# Patient Record
Sex: Male | Born: 1975 | Race: Black or African American | Marital: Married
Health system: Southern US, Community
[De-identification: ages and names within clinical notes are randomized; demographics above are authoritative.]

---

## 2011-01-25 ENCOUNTER — Encounter: Payer: Self-pay | Admitting: Podiatry

## 2013-08-10 ENCOUNTER — Ambulatory Visit
Admission: RE | Admit: 2013-08-10 | Discharge: 2013-08-10 | Disposition: A | Discharge status: Home / Self Care | Payer: PRIVATE HEALTH INSURANCE | Source: Ambulatory Visit | Attending: Family Medicine | Admitting: Family Medicine

## 2013-08-10 ENCOUNTER — Other Ambulatory Visit: Payer: Self-pay | Admitting: Family Medicine

## 2013-08-10 DIAGNOSIS — M542 Cervicalgia: Secondary | ICD-10-CM

## 2013-08-10 DIAGNOSIS — M25512 Pain in left shoulder: Secondary | ICD-10-CM

## 2014-08-11 IMAGING — CR DG CERVICAL SPINE COMPLETE 4+V
6 series · 6 of 6 positions shown · non-contrast
Comparison: None.

CLINICAL DATA: Neck pain with radicular symptoms

EXAM:
CERVICAL SPINE  4+ VIEWS

[view not recorded (1 of 6)]
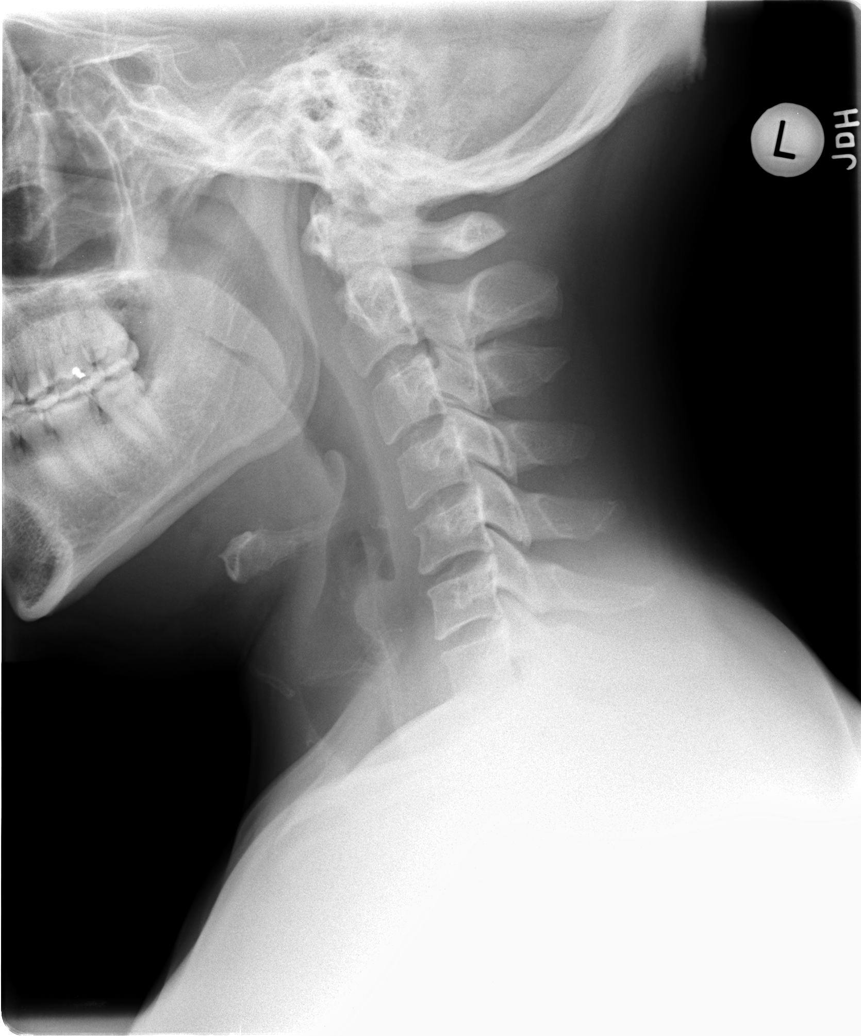

[view not recorded (2 of 6)]
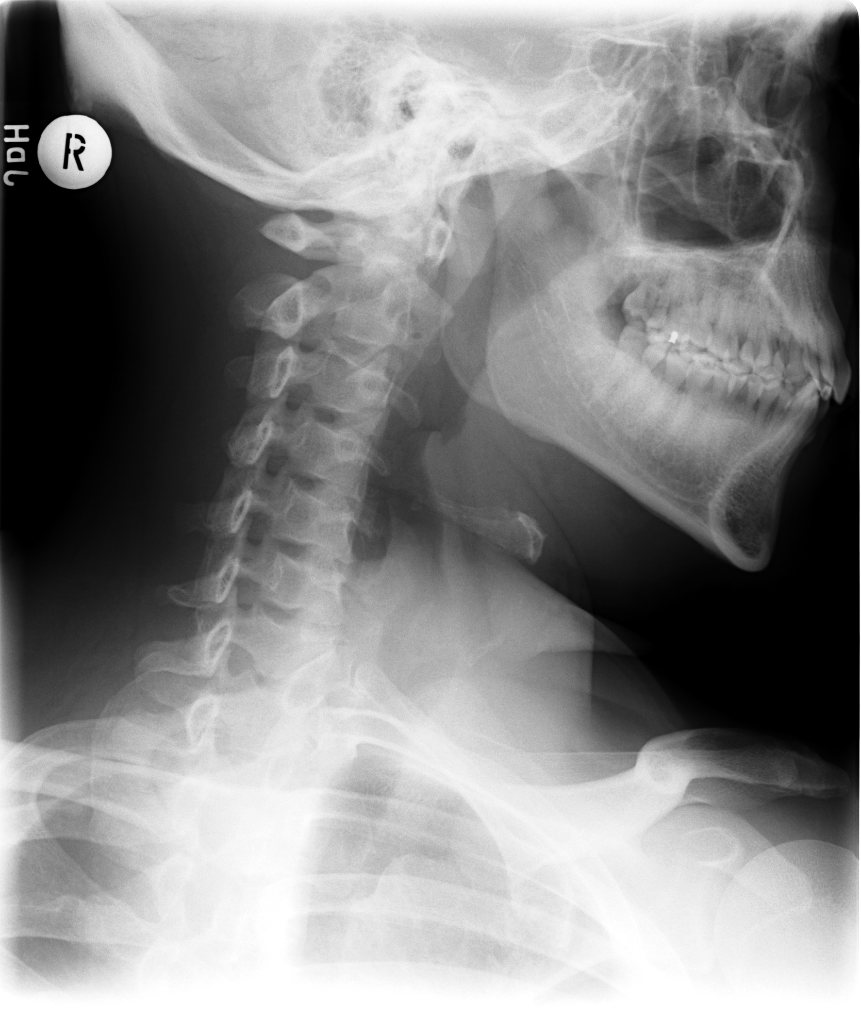

[view not recorded (3 of 6)]
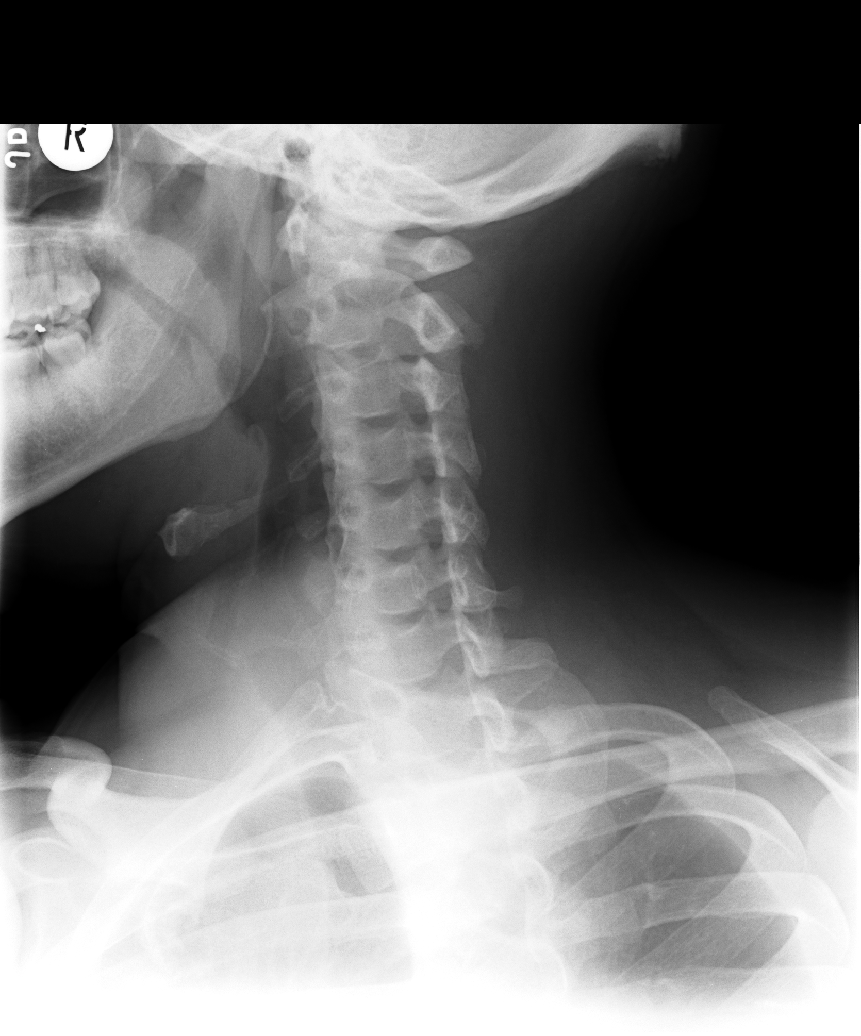

[view not recorded (4 of 6)]
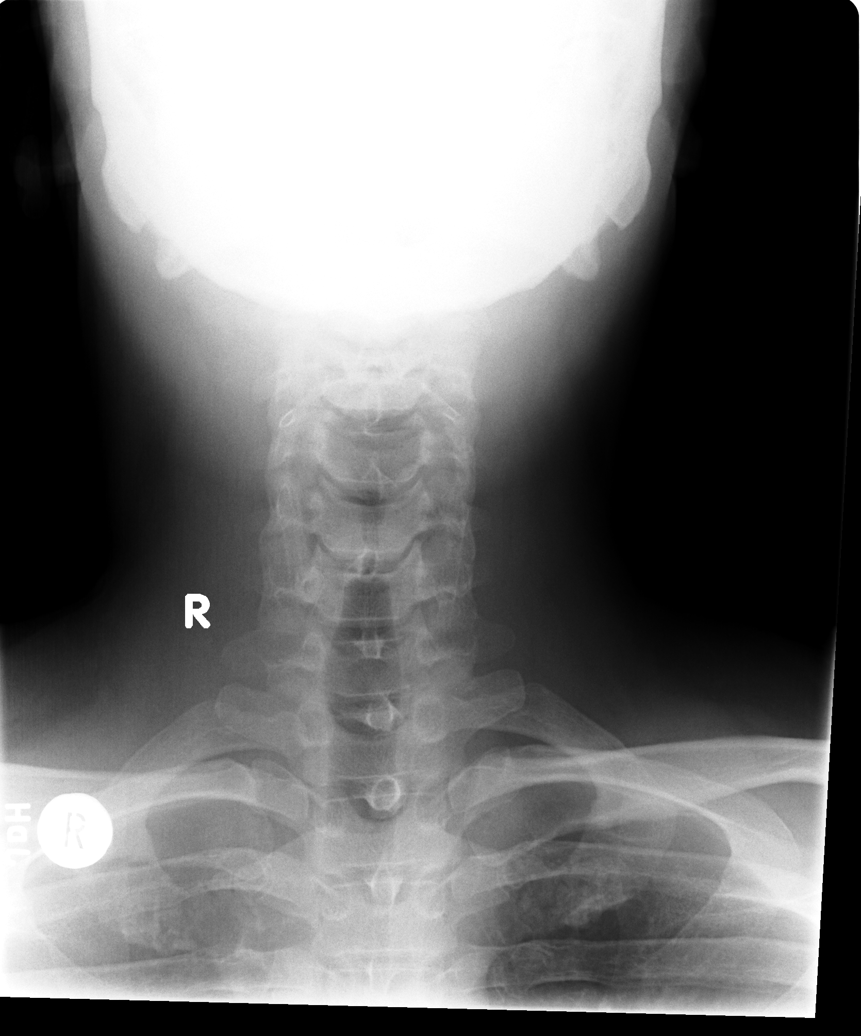

[view not recorded (5 of 6)]
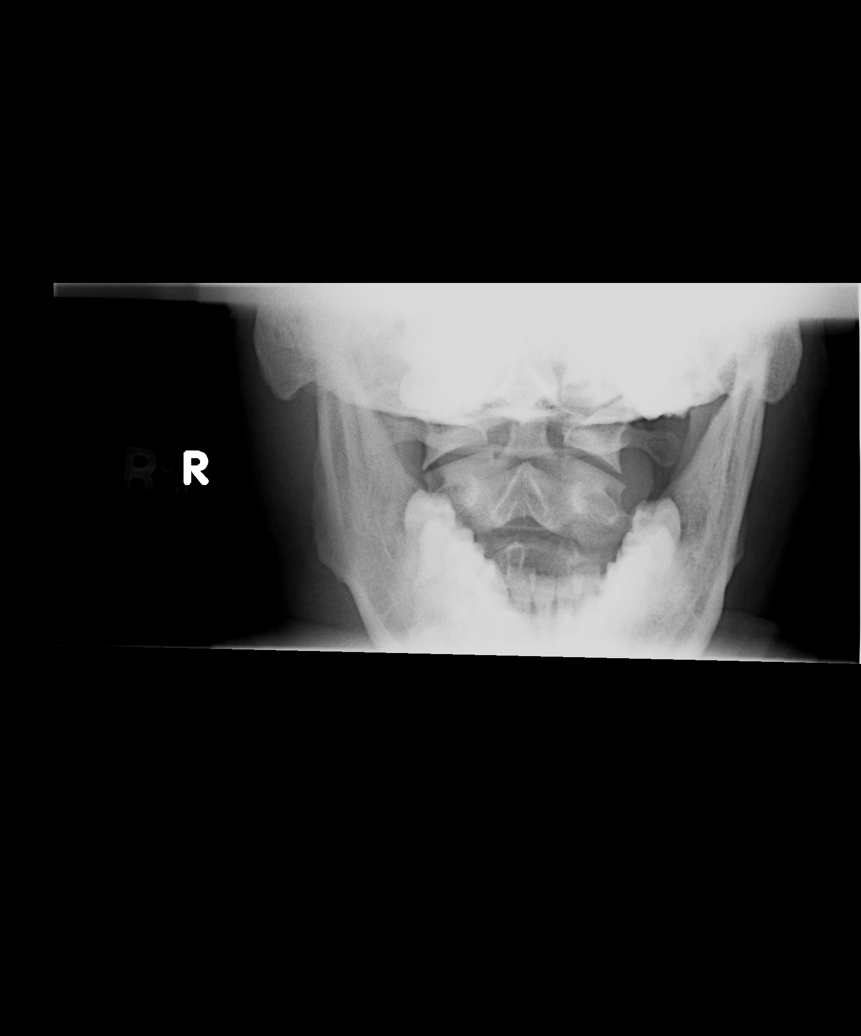

[view not recorded (6 of 6)]
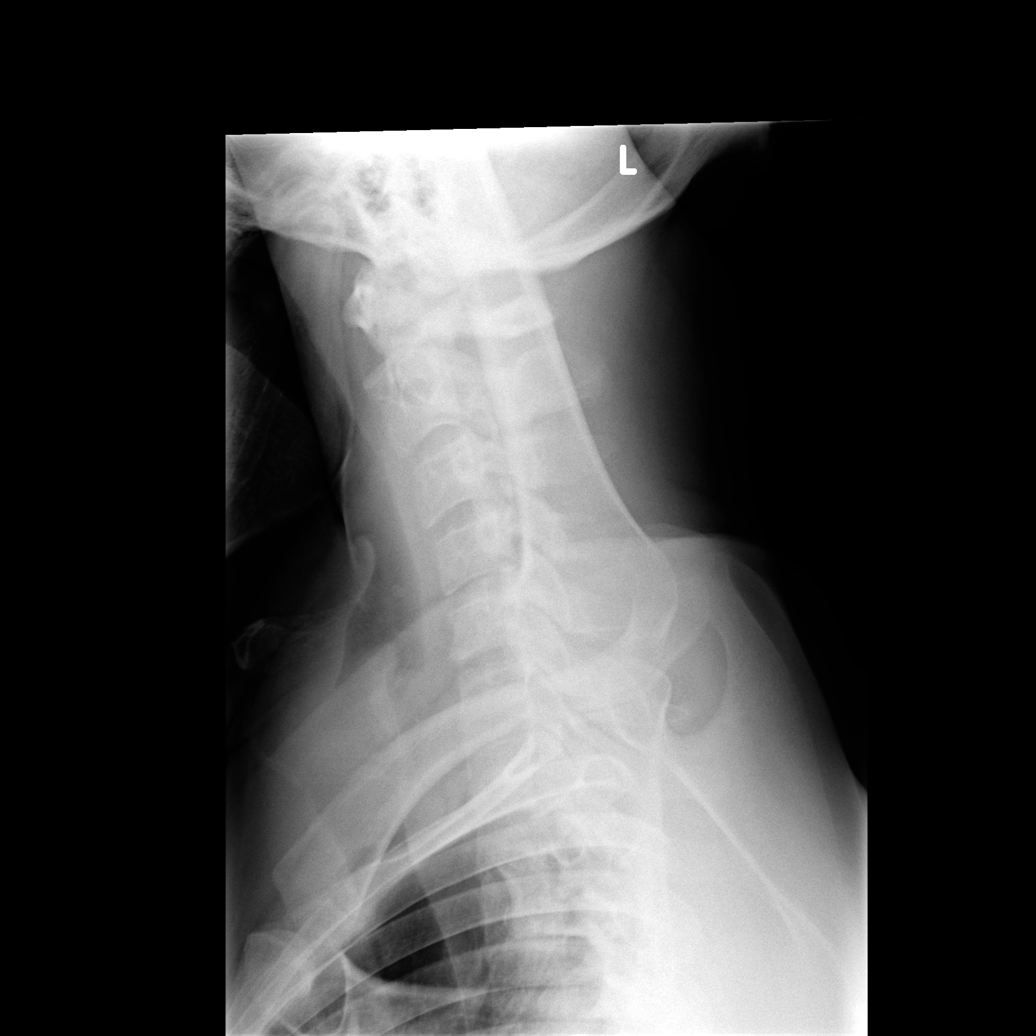

[6 of 6 positions shown; findings below may reference images not displayed]

FINDINGS: Frontal, lateral, open-mouth odontoid, and bilateral oblique views
were obtained. There is no fracture or spondylolisthesis.
Prevertebral soft tissues and predental space regions are normal.
There is mild disc space narrowing at C5-6. Other disc spaces appear
normal. There is no appreciable exit foraminal narrowing on the
oblique views. There is reversal of lordotic curvature.
IMPRESSION: Slight disc space narrowing at C5-6. No fracture or
spondylolisthesis. There is reversal of lordotic curvature, a
finding that probably is due to muscle spasm. If there is concern
for ligamentous injury, lateral flexion-extension views could be
helpful to further assess.

## 2016-09-06 ENCOUNTER — Other Ambulatory Visit: Payer: Self-pay | Admitting: Family Medicine

## 2016-09-06 ENCOUNTER — Ambulatory Visit
Admission: RE | Admit: 2016-09-06 | Discharge: 2016-09-06 | Disposition: A | Discharge status: Home / Self Care | Payer: PRIVATE HEALTH INSURANCE | Source: Ambulatory Visit | Attending: Family Medicine | Admitting: Family Medicine

## 2016-09-06 DIAGNOSIS — T1490XA Injury, unspecified, initial encounter: Secondary | ICD-10-CM
